# Patient Record
Sex: Female | Born: 1980 | Race: White | Hispanic: No | Marital: Married
Health system: Southern US, Community
[De-identification: ages and names within clinical notes are randomized; demographics above are authoritative.]

---

## 2011-08-20 ENCOUNTER — Other Ambulatory Visit: Payer: Self-pay | Admitting: Gastroenterology

## 2011-08-20 DIAGNOSIS — R109 Unspecified abdominal pain: Secondary | ICD-10-CM

## 2011-08-23 ENCOUNTER — Ambulatory Visit
Admission: RE | Admit: 2011-08-23 | Discharge: 2011-08-23 | Disposition: A | Discharge status: Home / Self Care | Payer: PRIVATE HEALTH INSURANCE | Source: Ambulatory Visit | Attending: Gastroenterology | Admitting: Gastroenterology

## 2011-08-23 DIAGNOSIS — R109 Unspecified abdominal pain: Secondary | ICD-10-CM

## 2011-08-23 MED ORDER — IOHEXOL 300 MG/ML  SOLN
100.0000 mL | Freq: Once | INTRAMUSCULAR | Status: AC | PRN
  Administered 2011-08-23: 100 mL via INTRAVENOUS

## 2011-09-11 ENCOUNTER — Other Ambulatory Visit (HOSPITAL_COMMUNITY): Payer: Self-pay | Admitting: Gastroenterology

## 2011-09-24 ENCOUNTER — Ambulatory Visit (HOSPITAL_COMMUNITY)
Admission: RE | Admit: 2011-09-24 | Discharge: 2011-09-24 | Disposition: A | Discharge status: Home / Self Care | Payer: PRIVATE HEALTH INSURANCE | Source: Ambulatory Visit | Attending: Gastroenterology | Admitting: Gastroenterology

## 2011-09-24 DIAGNOSIS — R109 Unspecified abdominal pain: Secondary | ICD-10-CM | POA: Insufficient documentation

## 2011-09-24 MED ORDER — TECHNETIUM TC 99M MEBROFENIN IV KIT
5.5000 | PACK | Freq: Once | INTRAVENOUS | Status: AC | PRN
  Administered 2011-09-24: 6 via INTRAVENOUS

## 2015-06-13 ENCOUNTER — Other Ambulatory Visit: Payer: Self-pay | Admitting: Gastroenterology

## 2015-06-13 DIAGNOSIS — R1033 Periumbilical pain: Secondary | ICD-10-CM

## 2021-04-02 ENCOUNTER — Other Ambulatory Visit: Payer: Self-pay | Admitting: Obstetrics and Gynecology

## 2021-04-02 DIAGNOSIS — R928 Other abnormal and inconclusive findings on diagnostic imaging of breast: Secondary | ICD-10-CM

## 2021-04-13 ENCOUNTER — Other Ambulatory Visit: Payer: Self-pay

## 2021-04-13 ENCOUNTER — Ambulatory Visit
Admission: RE | Admit: 2021-04-13 | Discharge: 2021-04-13 | Disposition: A | Discharge status: Home / Self Care | Payer: 59 | Source: Ambulatory Visit | Attending: Obstetrics and Gynecology | Admitting: Obstetrics and Gynecology

## 2021-04-13 ENCOUNTER — Ambulatory Visit
Admission: RE | Admit: 2021-04-13 | Discharge: 2021-04-13 | Disposition: A | Discharge status: Home / Self Care | Payer: Managed Care, Other (non HMO) | Source: Ambulatory Visit | Attending: Obstetrics and Gynecology | Admitting: Obstetrics and Gynecology

## 2021-04-13 DIAGNOSIS — R928 Other abnormal and inconclusive findings on diagnostic imaging of breast: Secondary | ICD-10-CM

## 2021-04-23 ENCOUNTER — Other Ambulatory Visit: Payer: Managed Care, Other (non HMO)

## 2021-12-11 IMAGING — US US BREAST*L* LIMITED INC AXILLA
1 series · 13 of 21 positions shown · non-contrast
Comparison: None.

CLINICAL DATA: Recall from screening to evaluate 2 possible left
breast masses.

EXAM:
DIGITAL DIAGNOSTIC UNILATERAL LEFT MAMMOGRAM WITH TOMOSYNTHESIS AND
CAD; ULTRASOUND LEFT BREAST LIMITED
TECHNIQUE: Left digital diagnostic mammography and breast tomosynthesis was
performed. The images were evaluated with computer-aided detection.;
Targeted ultrasound examination of the left breast was performed

[Series 1: us breast*left* limited inc axilla · 0.06mm/px · 13 of 21 slices shown]
[im 1/21]
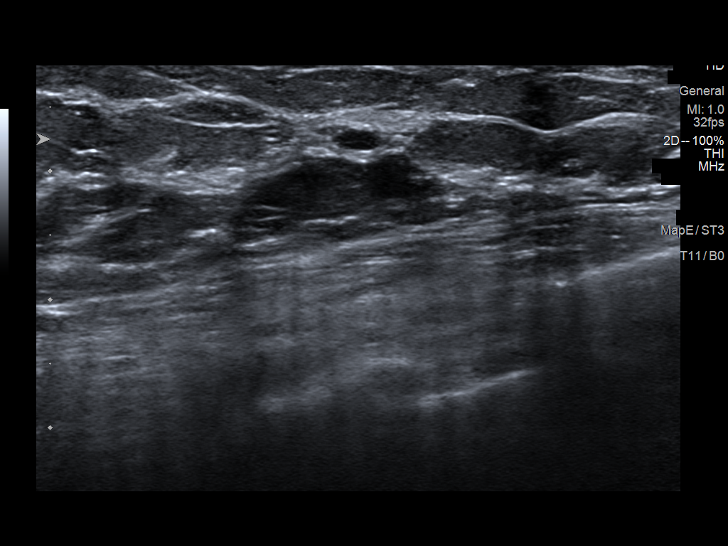
[im 3/21]
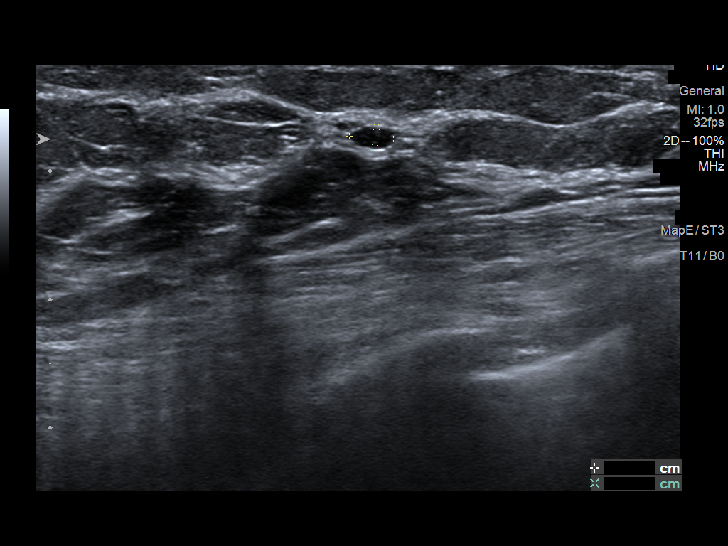
[im 5/21]
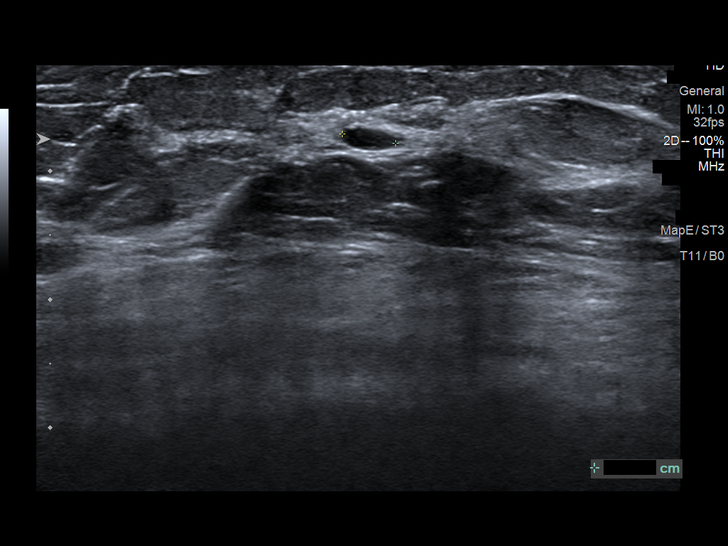
[im 6/21]
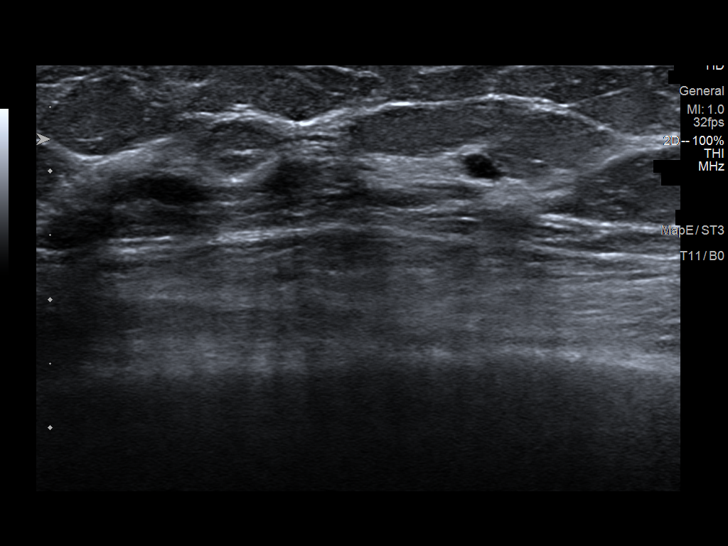
[im 8/21]
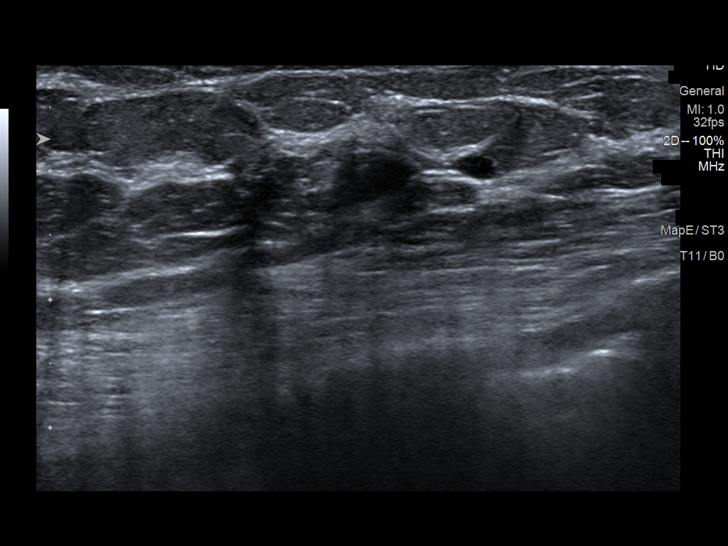
[im 9/21]
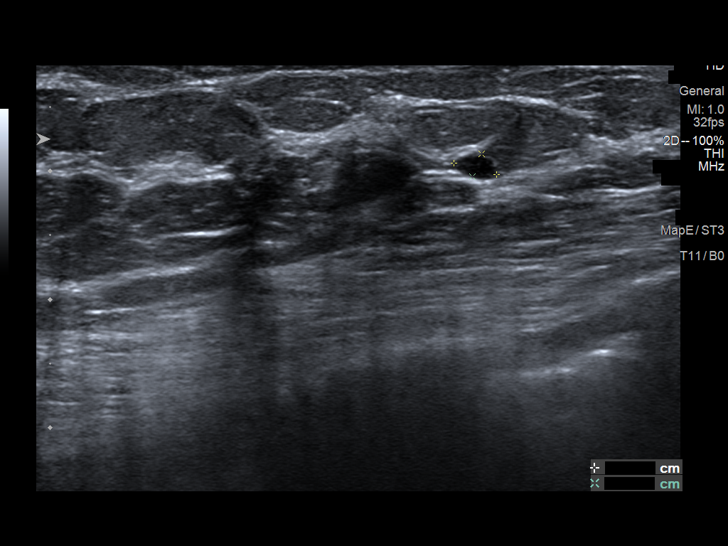
[im 11/21]
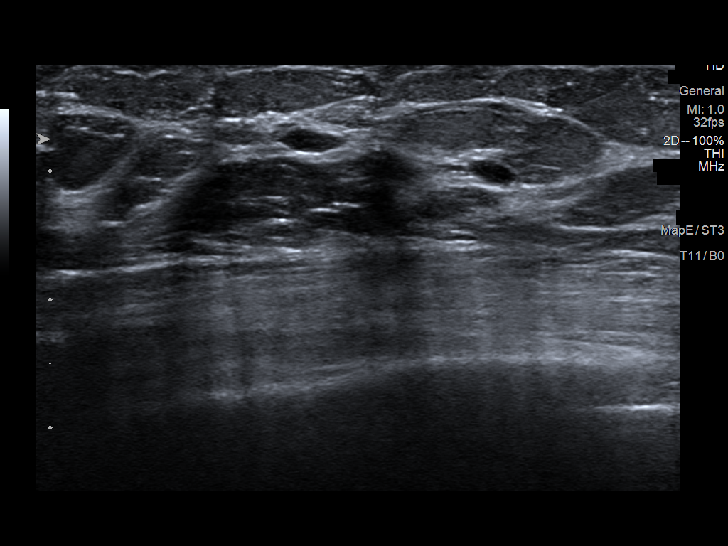
[im 13/21]
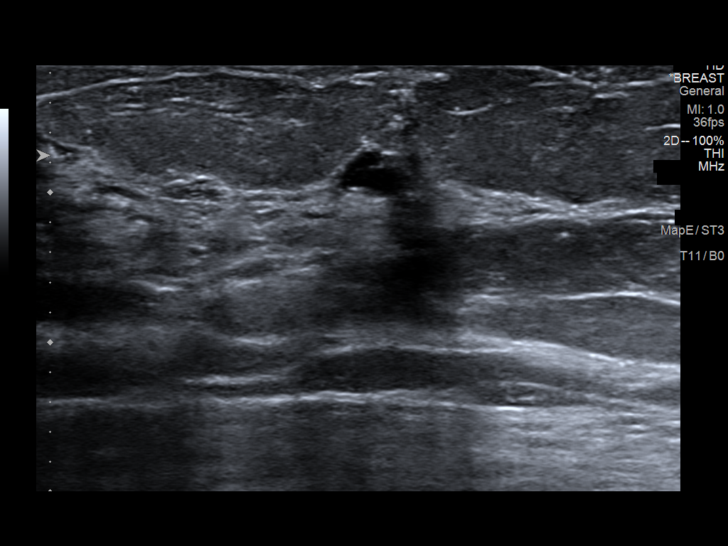
[im 14/21]
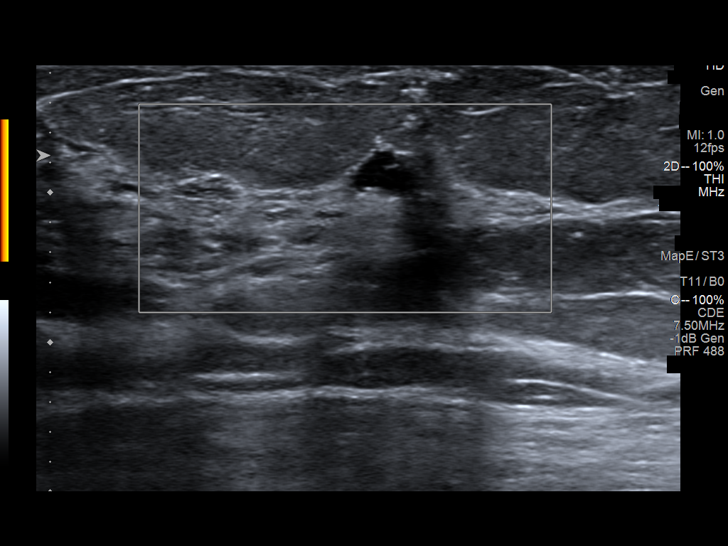
[im 16/21]
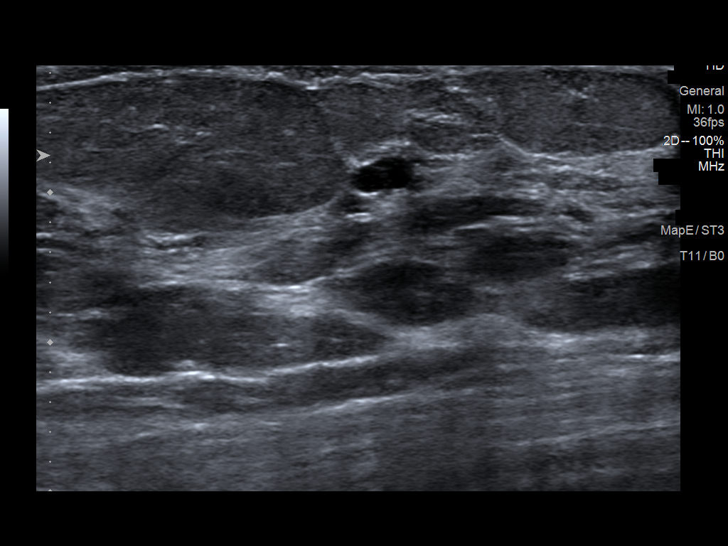
[im 17/21]
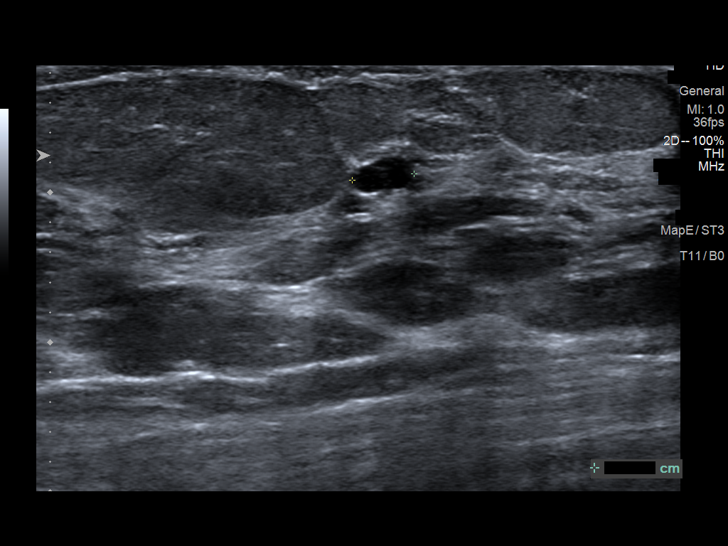
[im 19/21]
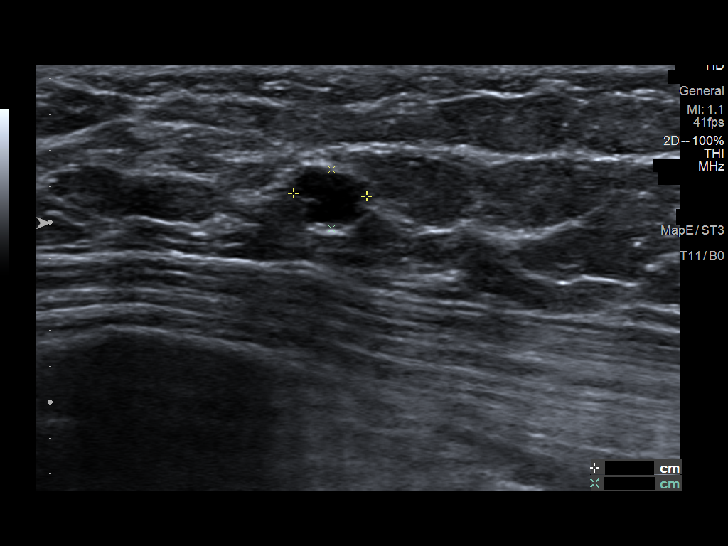
[im 21/21]
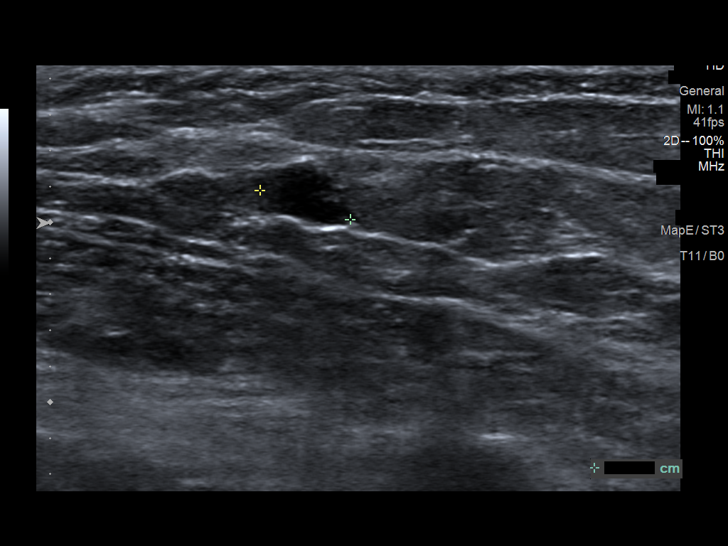

[13 of 21 positions shown; findings below may reference images not displayed]

ACR Breast Density Category b: There are scattered areas of
fibroglandular density.
FINDINGS: Multiple additional images of the left breast were obtained. The
questionable mass over the posterior third of the slightly medial
left breast does not persist. The possible mass over the far medial
posterior left breast persists as an oval circumscribed
subcentimeter mass. This appears to be over the upper left breast on
the MLO and lateral images.

Targeted ultrasound is performed, showing an oval cyst over the 10
o'clock position of the left breast 10 cm from the nipple likely
accounting for the mammographic finding over the far medial
posterior breast. This measures 3 x 4 x 5 mm. A few other
incidentally noted subcentimeter cysts are seen at the 230-3 o'clock
position and 12 o'clock position.
IMPRESSION: 5 mm cyst over the 10 o'clock position of the left breast accounting
for the mammographic finding. Several other incidentally noted
subcentimeter cysts over the 12 o'clock and 230-3 o'clock position.

RECOMMENDATION:
Recommend continued annual bilateral screening mammographic
follow-up.

I have discussed the findings and recommendations with the patient.
If applicable, a reminder letter will be sent to the patient
regarding the next appointment.

BI-RADS CATEGORY  2: Benign.

## 2021-12-11 IMAGING — MG MM DIGITAL DIAGNOSTIC UNILAT*L* W/ TOMO W/ CAD
6 of 10 series · 6 of 30 positions shown · non-contrast
Comparison: None.

CLINICAL DATA: Recall from screening to evaluate 2 possible left
breast masses.

EXAM:
DIGITAL DIAGNOSTIC UNILATERAL LEFT MAMMOGRAM WITH TOMOSYNTHESIS AND
CAD; ULTRASOUND LEFT BREAST LIMITED
TECHNIQUE: Left digital diagnostic mammography and breast tomosynthesis was
performed. The images were evaluated with computer-aided detection.;
Targeted ultrasound examination of the left breast was performed

[L MLO synth-2D (1 of 2)]
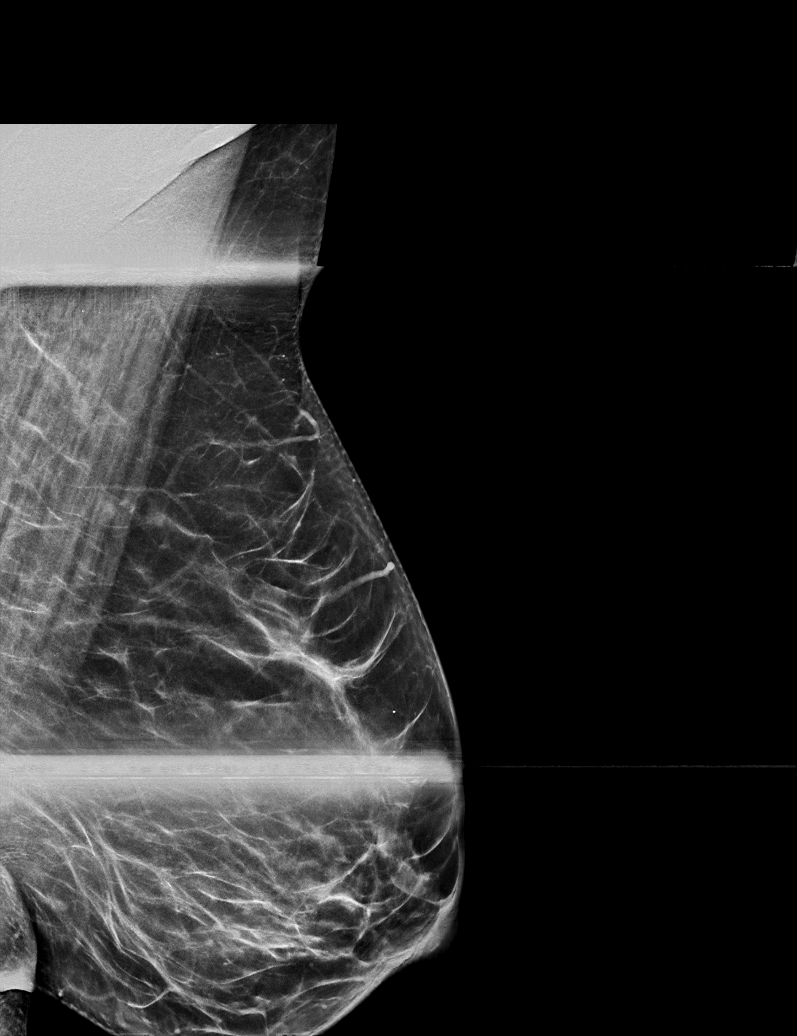

[L CC synth-2D (1 of 2)]
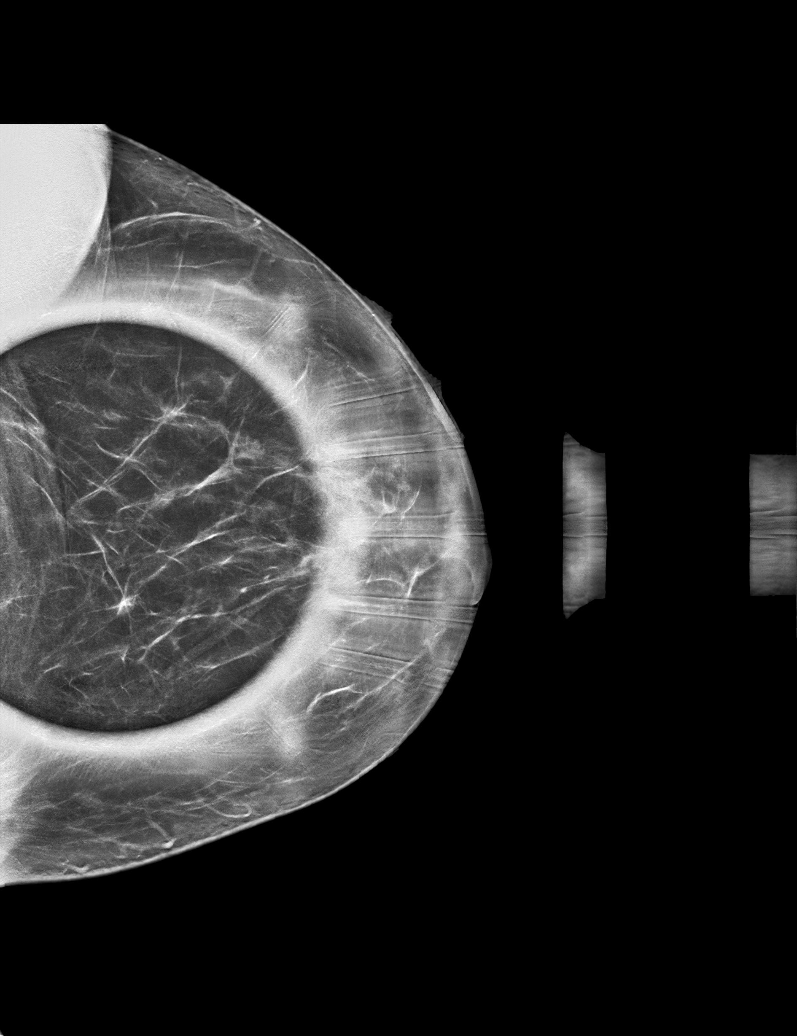

[L ML synth-2D]
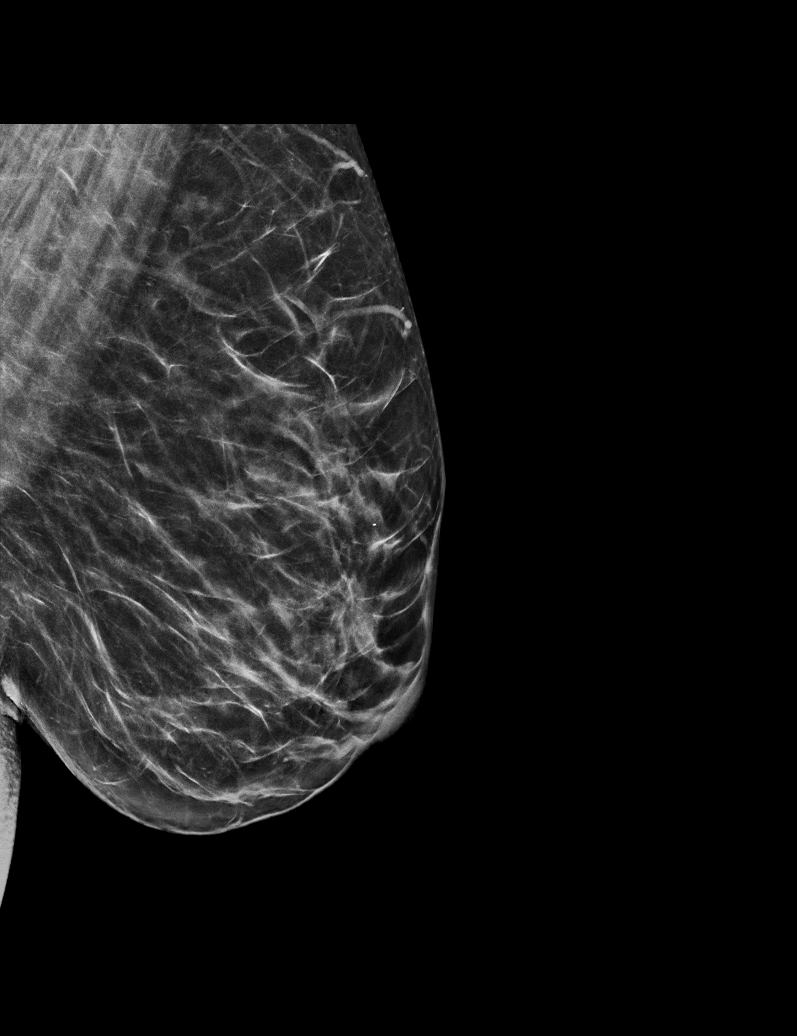

[L MLO synth-2D (2 of 2)]
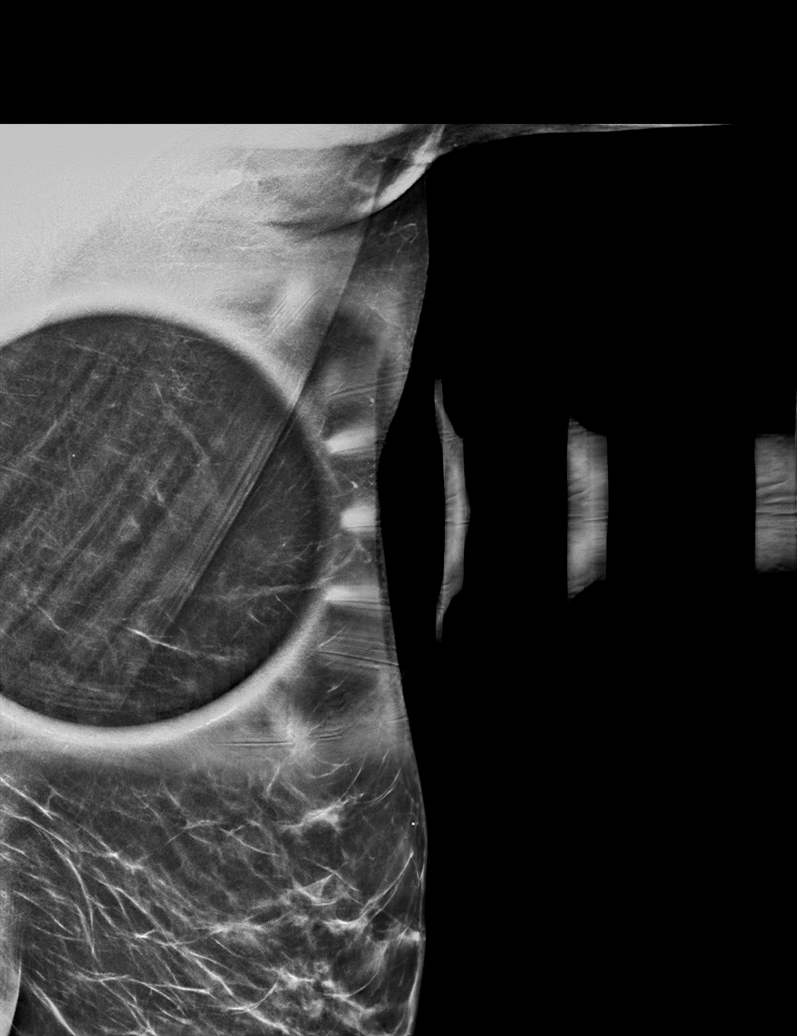

[L CC synth-2D (2 of 2)]
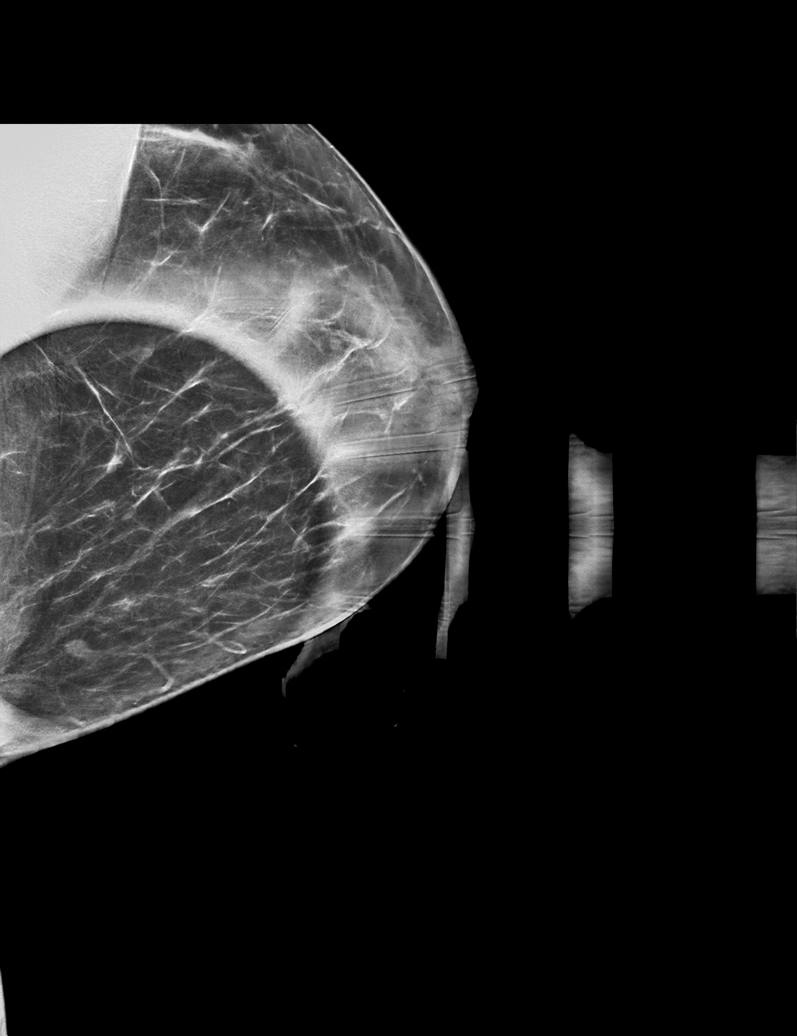

[L MLO tomo · tomo slice 32/63.0]
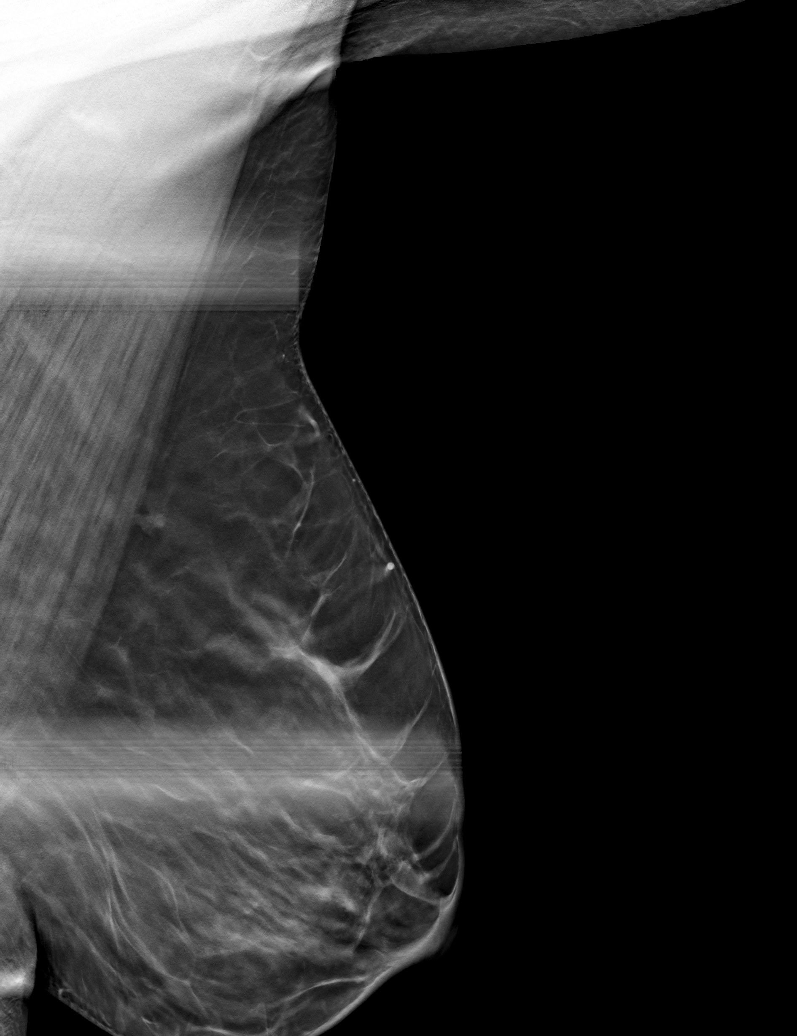

[6 of 30 positions shown; findings below may reference images not displayed]

ACR Breast Density Category b: There are scattered areas of
fibroglandular density.
FINDINGS: Multiple additional images of the left breast were obtained. The
questionable mass over the posterior third of the slightly medial
left breast does not persist. The possible mass over the far medial
posterior left breast persists as an oval circumscribed
subcentimeter mass. This appears to be over the upper left breast on
the MLO and lateral images.

Targeted ultrasound is performed, showing an oval cyst over the 10
o'clock position of the left breast 10 cm from the nipple likely
accounting for the mammographic finding over the far medial
posterior breast. This measures 3 x 4 x 5 mm. A few other
incidentally noted subcentimeter cysts are seen at the 230-3 o'clock
position and 12 o'clock position.
IMPRESSION: 5 mm cyst over the 10 o'clock position of the left breast accounting
for the mammographic finding. Several other incidentally noted
subcentimeter cysts over the 12 o'clock and 230-3 o'clock position.

RECOMMENDATION:
Recommend continued annual bilateral screening mammographic
follow-up.

I have discussed the findings and recommendations with the patient.
If applicable, a reminder letter will be sent to the patient
regarding the next appointment.

BI-RADS CATEGORY  2: Benign.

## 2022-05-10 ENCOUNTER — Telehealth: Payer: Self-pay

## 2022-05-10 NOTE — Telephone Encounter (Signed)
Spoke with patients spouse looking to do a transfer of care from Dr. Lorie Apley office requested records for review.

## 2023-07-03 ENCOUNTER — Other Ambulatory Visit: Payer: 59

## 2023-07-03 ENCOUNTER — Ambulatory Visit
Admission: RE | Admit: 2023-07-03 | Discharge: 2023-07-03 | Disposition: A | Discharge status: Home / Self Care | Payer: 59 | Source: Ambulatory Visit | Attending: Physician Assistant | Admitting: Physician Assistant

## 2023-07-03 ENCOUNTER — Encounter: Payer: Self-pay | Admitting: Physician Assistant

## 2023-07-03 ENCOUNTER — Other Ambulatory Visit: Payer: Self-pay | Admitting: Physician Assistant

## 2023-07-03 DIAGNOSIS — R109 Unspecified abdominal pain: Secondary | ICD-10-CM

## 2023-07-10 ENCOUNTER — Other Ambulatory Visit: Payer: Self-pay | Admitting: Obstetrics and Gynecology

## 2023-07-10 DIAGNOSIS — Z1231 Encounter for screening mammogram for malignant neoplasm of breast: Secondary | ICD-10-CM

## 2023-09-09 ENCOUNTER — Emergency Department (HOSPITAL_COMMUNITY)
Admission: EM | Admit: 2023-09-09 | Discharge: 2023-09-09 | Disposition: A | Discharge status: Home / Self Care | Payer: Worker's Compensation | Attending: Emergency Medicine | Admitting: Emergency Medicine

## 2023-09-09 DIAGNOSIS — H578A2 Foreign body sensation, left eye: Secondary | ICD-10-CM | POA: Insufficient documentation

## 2023-09-09 MED ORDER — POLYMYXIN B-TRIMETHOPRIM 10000-0.1 UNIT/ML-% OP SOLN
1.0000 [drp] | OPHTHALMIC | Status: DC
  Administered 2023-09-09: 1 [drp] via OPHTHALMIC
  Filled 2023-09-09: qty 10

## 2023-09-09 MED ORDER — TETRACAINE HCL 0.5 % OP SOLN
2.0000 [drp] | Freq: Once | OPHTHALMIC | Status: AC
  Administered 2023-09-09: 2 [drp] via OPHTHALMIC
  Filled 2023-09-09: qty 4

## 2023-09-09 MED ORDER — FLUORESCEIN SODIUM 1 MG OP STRP
1.0000 | ORAL_STRIP | Freq: Once | OPHTHALMIC | Status: AC
  Administered 2023-09-09: 1 via OPHTHALMIC
  Filled 2023-09-09: qty 1

## 2023-09-09 NOTE — ED Triage Notes (Signed)
Pt reports she may have gotten something in her left eye around 12 pm yesterday. 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 Pt reports swelling started last night and was worse upon waking. Pt denies any acute vision changes ,but reports she wears glasses at baseline and the affected eye is also her weak eye, but overall reports no changes other than swelling and watery.

## 2023-09-09 NOTE — ED Provider Notes (Signed)
[place] EMERGENCY DEPARTMENT AT [facility] Provider Note   CSN: [id number] Arrival date & time: 09/09/23  0550     History  Chief Complaint  Patient presents with   Eye Problem    Shannon Bautista is a 42 y.o. female with no significant past medical history presents to the emergency department for sensation of foreign body in left eye since 1200 yesterday.  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  She complains of left eye pain with movement due to FB sensation on upper lid and photophobia.  She normally wears glasses and her left eye needs more correction than right eye and denies visual disturbance or worsening vision. She has irrigated eye with eye cleansing agent at home without relief. She denies chemical exposure, pruritis, known injury to eye, contact use, recent infection, fever, or sinusitis.   Eye Problem Associated symptoms: redness       Home Medications Prior to Admission medications   Not on File      Allergies    Bee venom    Review of Systems   Review of Systems  Constitutional:  Negative for fever.  Eyes:  Positive for pain and redness.  Respiratory:  Negative for cough and shortness of breath.   Gastrointestinal:  Negative for abdominal pain.    Physical Exam Updated Vital Signs BP 126/87   Pulse 81   Temp 98.2 F (36.8 C) (Oral)   Resp 17   Ht 5\' 4"  (1.626 m)   Wt 56.7 kg   SpO2 100%   BMI 21.46 kg/m  Physical Exam Vitals and nursing note reviewed.  Constitutional:      General: She is not in acute distress.    Appearance: Normal appearance.  HENT:     Head: Normocephalic and atraumatic.     Nose: Nose normal. No congestion or rhinorrhea.     Mouth/Throat:     Mouth: Mucous membranes are moist.     Pharynx: No oropharyngeal exudate or posterior oropharyngeal erythema.  Eyes:     General: No scleral icterus.       Right eye: No discharge.         Left eye: Discharge (Nonpurulent) present.    Extraocular Movements: Extraocular movements intact.     Conjunctiva/sclera: Conjunctivae normal.     Pupils: Pupils are equal, round, and reactive to light.     Comments: Mild pain with abduction and adduction of left eye No hyphema, chemosis, proptosis, tear drop pupil  Cardiovascular:     Rate and Rhythm: Normal rate.     Pulses: Normal pulses.  Pulmonary:     Effort: Pulmonary effort is normal. No respiratory distress.  Musculoskeletal:     Cervical back: Neck supple. No rigidity or tenderness.  Lymphadenopathy:     Cervical: No cervical adenopathy.  Skin:    Coloration: Skin is not jaundiced or pale.     Comments: No periorbital edema, warmth, erythema No swelling to eyelids bilaterally  Neurological:     Mental Status: She is alert. Mental status is at baseline.     ED Results / Procedures / Treatments   Labs (all labs ordered are listed, but only abnormal results are displayed) Labs Reviewed - No data to display  EKG None  Radiology No results found.  Procedures Procedures    Medications Ordered in ED Medications  trimethoprim-polymyxin b (POLYTRIM) ophthalmic solution 1 drop (  1 drop Left Eye Given 09/09/23 0736)  tetracaine (PONTOCAINE) 0.5 % ophthalmic solution 2 drop (2 drops Left Eye Given 09/09/23 0739)  fluorescein ophthalmic strip 1 strip (1 strip Left Eye Given 09/09/23 0739)    ED Course/ Medical Decision Making/ A&P                                 Medical Decision Making   Patient presents to the ED for concern of left eye FB sensation, this involves an extensive number of treatment options, and is a complaint that carries with it a high risk of complications and morbidity.  The differential diagnosis includes FB, corneal ulcer, corneal abrasion, globus rupture, orbital cellulits   Co morbidities that complicate the patient evaluation  None   Additional history obtained:  External records  from outside source obtained upon chart review   Lab Tests:  As patient has no systemic symptoms and is hemodynamically stable with focused eye complaint.  I do not feel that lab work is required at this time   Imaging Studies ordered:  As patient has EOM intact and reassuring physical exam for no globus rupture, abrasion, ulcer to the eye, periorbital or lid edema, change in [place], I do not feel that imaging of eyes is required at this time.   Medicines ordered and prescription drug management:  I ordered medication including tetracaine  for left eye pain and assessment  Reevaluation of the patient after these medicines showed that the patient improved I have reviewed the patients home medicines and have made adjustments as needed   Problem List / ED Course:  Left eye foreign body sensation   Reevaluation:  After the interventions noted above, I reevaluated the patient and found that they have :improved    Dispostion:  Upon evaluation, patient is resting comfortably in bed.  She is not ill-appearing. VS WNL. see HPI.  After consideration of the diagnostic results and the patients response to treatment, I feel that the patent would benefit from outpatient management with Polytrim drops to cover for possible infection.  Upon physical exam with fluorescein stain, no abrasion, ulcer, nor significant fluorescein uptake. No FB visualized in eye or with lid eversion. Seidel test negative.  No teardrop pupil or globe rupture noted.  Left eye pressure normal at 17.  Visual acuity in left eye 20/400 when compared to right eye 20/40 however patient reports that this is normal for her as she has a lazy eye and denies worsening of vision following FB sensation. She reports mild pain to left eye with abduction and adduction.  There is no periorbital swelling, erythema, lid swelling, proptosis, nor chemosis to suggest orbital cellulitis.  Patient reports that she does not know if she had a  tetanus shot. Discussed importance of tetanus shot as unknown when her last administration was.  However, she refuses tetanus shot today.  I also offered irrigation of left eye due to FB sensation but she refused.  Discussed physical exam findings and treatment plan with patient who expresses understanding and agrees with plan.  She is to follow-up with primary care if symptoms do not improve  Patient is stable for discharge.  Will provide patient with Polytrim drops to use for 7 to 10 days as prescribed.  Discussed following up with primary care provider if symptoms do not improve following drops.  Discussed return to emergency department precautions to include but not limited to worsening  vision, decreased EOM, pain with EOM, swelling, erythema to left periorbital area or eyelid with associated fever.  Patient expresses understanding and agrees with plan.  Fayrene Helper PA assessed eye with fluorescein stain and for foreign body. He agrees with treatment plan.        Final Clinical Impression(s) / ED Diagnoses Final diagnoses:  Foreign body sensation, left eye    Rx / DC Orders ED Discharge Orders     None         Judithann Sheen, PA 09/09/23 0935    Gilda Crease, MD 09/10/23 [phone]

## 2023-09-09 NOTE — Discharge Instructions (Addendum)
Thank you for letting us evaluate you today.  We did not see any foreign body in eye or under eyelid.  You do not have any scratches to your cornea.  I have sent eyedrops to your pharmacy with instructions as provided : 1 drop of polymyxin B sulfate and trimethoprim sulfate ophthalmic solution to affected eye(s) every 3 hours for 7 to 10 days; MAX 6 doses/day to [facility] at [address]  I have included information regarding a wellness clinic if you need another PCP.  You can follow-up with them regarding symptom management. please return to emergency department if you experience worsening symptoms, worsening vision, loss of vision, worsening swelling around the eye or excruciating pain with eye movement despite drops.

## 2023-09-11 ENCOUNTER — Telehealth (HOSPITAL_COMMUNITY): Payer: Self-pay | Admitting: Emergency Medicine

## 2023-09-11 MED ORDER — BACITRACIN-POLYMYXIN B 500-10000 UNIT/GM OP OINT: 1.0000 | TOPICAL_OINTMENT | OPHTHALMIC | 0 refills | Status: DC

## 2023-09-11 MED ORDER — POLYMYXIN B-TRIMETHOPRIM 10000-0.1 UNIT/ML-% OP SOLN: 1.0000 [drp] | OPHTHALMIC | 0 refills | Status: AC

## 2023-09-11 NOTE — Telephone Encounter (Signed)
Patient called because antibiotic eyedrops were never sent to the pharmacy.  These have been sent to her her preferred pharmacy electronically.
